# Patient Record
Sex: Male | Born: 1995 | Hispanic: No | Smoking: Heavy tobacco smoker
Health system: Southern US, Community
[De-identification: ages and names within clinical notes are randomized; demographics above are authoritative.]

---

## 2017-05-05 ENCOUNTER — Emergency Department
Admission: EM | Admit: 2017-05-05 | Discharge: 2017-05-05 | Disposition: A | Discharge status: Home / Self Care | Payer: Self-pay | Attending: Emergency Medicine | Admitting: Emergency Medicine

## 2017-05-05 ENCOUNTER — Encounter: Payer: Self-pay | Admitting: Emergency Medicine

## 2017-05-05 ENCOUNTER — Emergency Department: Payer: Self-pay

## 2017-05-05 DIAGNOSIS — Y929 Unspecified place or not applicable: Secondary | ICD-10-CM | POA: Insufficient documentation

## 2017-05-05 DIAGNOSIS — F17200 Nicotine dependence, unspecified, uncomplicated: Secondary | ICD-10-CM | POA: Insufficient documentation

## 2017-05-05 DIAGNOSIS — X501XXA Overexertion from prolonged static or awkward postures, initial encounter: Secondary | ICD-10-CM | POA: Insufficient documentation

## 2017-05-05 DIAGNOSIS — Y9301 Activity, walking, marching and hiking: Secondary | ICD-10-CM | POA: Insufficient documentation

## 2017-05-05 DIAGNOSIS — S93401A Sprain of unspecified ligament of right ankle, initial encounter: Secondary | ICD-10-CM | POA: Insufficient documentation

## 2017-05-05 DIAGNOSIS — Y999 Unspecified external cause status: Secondary | ICD-10-CM | POA: Insufficient documentation

## 2017-05-05 NOTE — Discharge Instructions (Signed)
Your exam and x-ray are negative for a fracture or dislocation. You have a grade II ankle sprain. Wear the splint for support as needed. Apply ice to reduce swelling. Take Ibuprofen for pain and inflammation. Rest with the foot elevated when seated. See Dr. Orland Jarredroxler for symptoms lasting longer than expected.

## 2017-05-05 NOTE — ED Triage Notes (Signed)
Pt arrived to the ED accompanied by his significant other for complaints of right ankle pain secondary to "rolling it" while at work. Pt is AOx4 in no apparent distress.

## 2017-05-06 NOTE — ED Provider Notes (Signed)
[facility] Emergency Department Provider Note ____________________________________________  Time seen: 2218  I have reviewed the triage vital signs and the nursing notes.  HISTORY  Chief Complaint  Ankle Pain  HPI Dean Sampson is a 21 y.o. male presents to the ED for evaluation of right ankle pain. He reports "rolling' his ankle at work today. He denies any other injuries. He notes swelling and pain with walking.   History reviewed. No pertinent past medical history.  There are no active problems to display for this patient.  History reviewed. No pertinent surgical history.  Prior to Admission medications   Not on File    Allergies Patient has no known allergies.  History reviewed. No pertinent family history.  Social History Social History  Substance Use Topics  . Smoking status: Heavy Tobacco Smoker  . Smokeless tobacco: Never Used  . Alcohol use Yes    Review of Systems  Constitutional: Negative for fever. Cardiovascular: Negative for chest pain. Respiratory: Negative for shortness of breath. Musculoskeletal: Negative for back pain. Right ankle pain and swelling  Skin: Negative for rash. Neurological: Negative for headaches, focal weakness or numbness. ____________________________________________  PHYSICAL EXAM:  VITAL SIGNS: ED Triage Vitals  Enc Vitals Group     BP 05/05/17 2118 (!) 146/62     Pulse Rate 05/05/17 2118 76     Resp 05/05/17 2118 18     Temp 05/05/17 2118 98.9 F (37.2 C)     Temp Source 05/05/17 2118 Oral     SpO2 05/05/17 2118 100 %     Weight 05/05/17 2120 245 lb (111.1 kg)     Height 05/05/17 2120 6\' 1"  (1.854 m)     Head Circumference --      Peak Flow --      Pain Score 05/05/17 2118 8     Pain Loc --      Pain Edu? --      Excl. in GC? --    Constitutional: Alert and oriented. Well appearing and in no distress. Cardiovascular: Normal distal pulses. Respiratory: Normal respiratory effort.  Musculoskeletal: Right ankle without obvious deformity, dislocation, or effusion. There is focal lateral malleolus swelling noted. No calf or Achilles tenderness is appreciated. Negative anterior/posterior drawer. Nontender with normal range of motion in all extremities.  Skin:  Skin is warm, dry and intact. No rash noted. ___________________________________________   RADIOLOGY  Right Ankle  IMPRESSION: Soft tissue edema without acute osseous abnormality.  I, Cordney Barstow, Charlesetta IvoryJenise V Bacon, personally viewed and evaluated these images (plain radiographs) as part of my medical decision making, as well as reviewing the written report by the radiologist. ____________________________________________  PROCEDURES  Velcro ankle stirrup  ____________________________________________  INITIAL IMPRESSION / ASSESSMENT AND PLAN / ED COURSE  Patient eating evaluation of a grade 2 right ankle sprain. No radiologic evidence of fracture or dislocation. An appropriate Velcro splint and given instructions to follow with [facility] as needed. A work is provided for 1 day as requested. ____________________________________________  FINAL CLINICAL IMPRESSION(S) / ED DIAGNOSES  Final diagnoses:  Sprain of right ankle, unspecified ligament, initial encounter      Lissa HoardMenshew, Nashali Ditmer V Bacon, PA-C 05/06/17 0010    Myrna BlazerSchaevitz, David Matthew, MD 05/07/17 [phone]

## 2018-03-09 IMAGING — CR DG ANKLE COMPLETE 3+V*R*
3 series · 3 of 3 positions shown · non-contrast
Comparison: None.

CLINICAL DATA: Right ankle pain after fall. Rolled ankle while at
work.

EXAM:
RIGHT ANKLE - COMPLETE 3+ VIEW

[ankle ap]
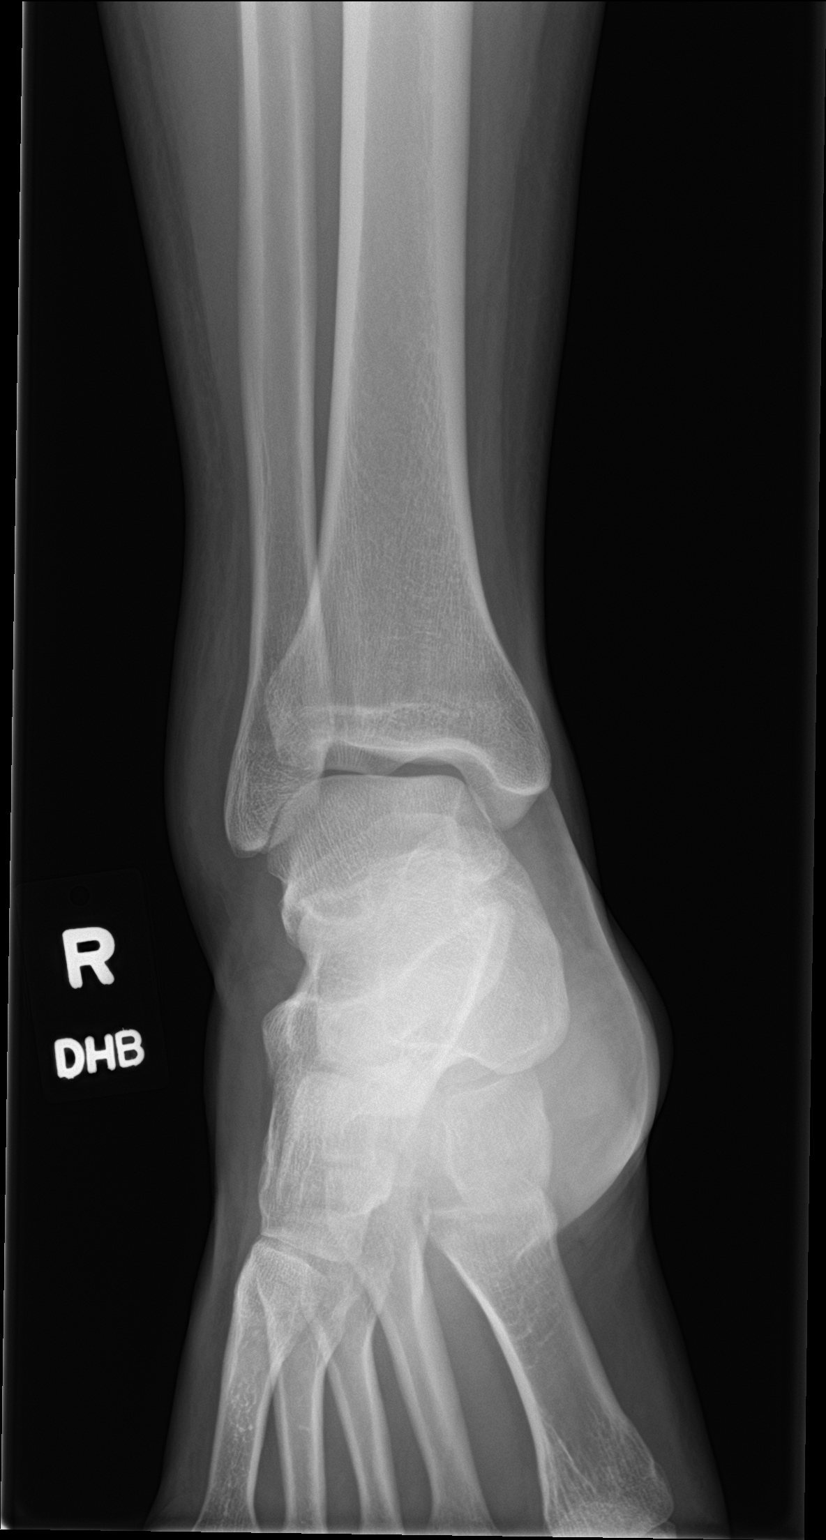

[ankle obl]
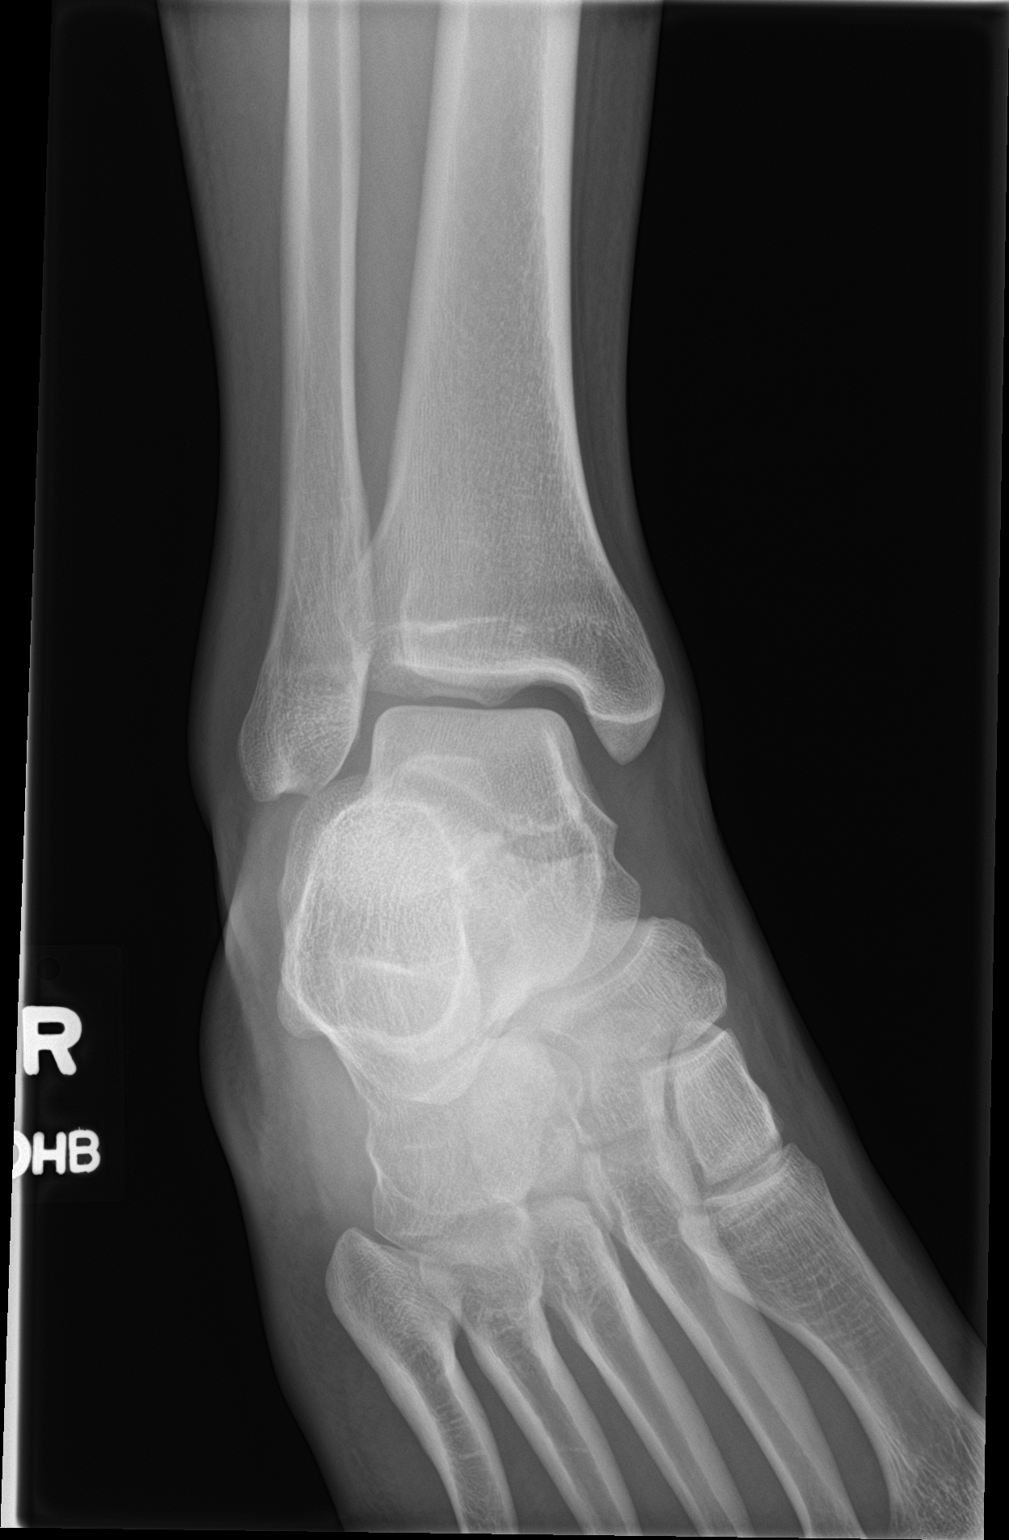

[ankle lat]
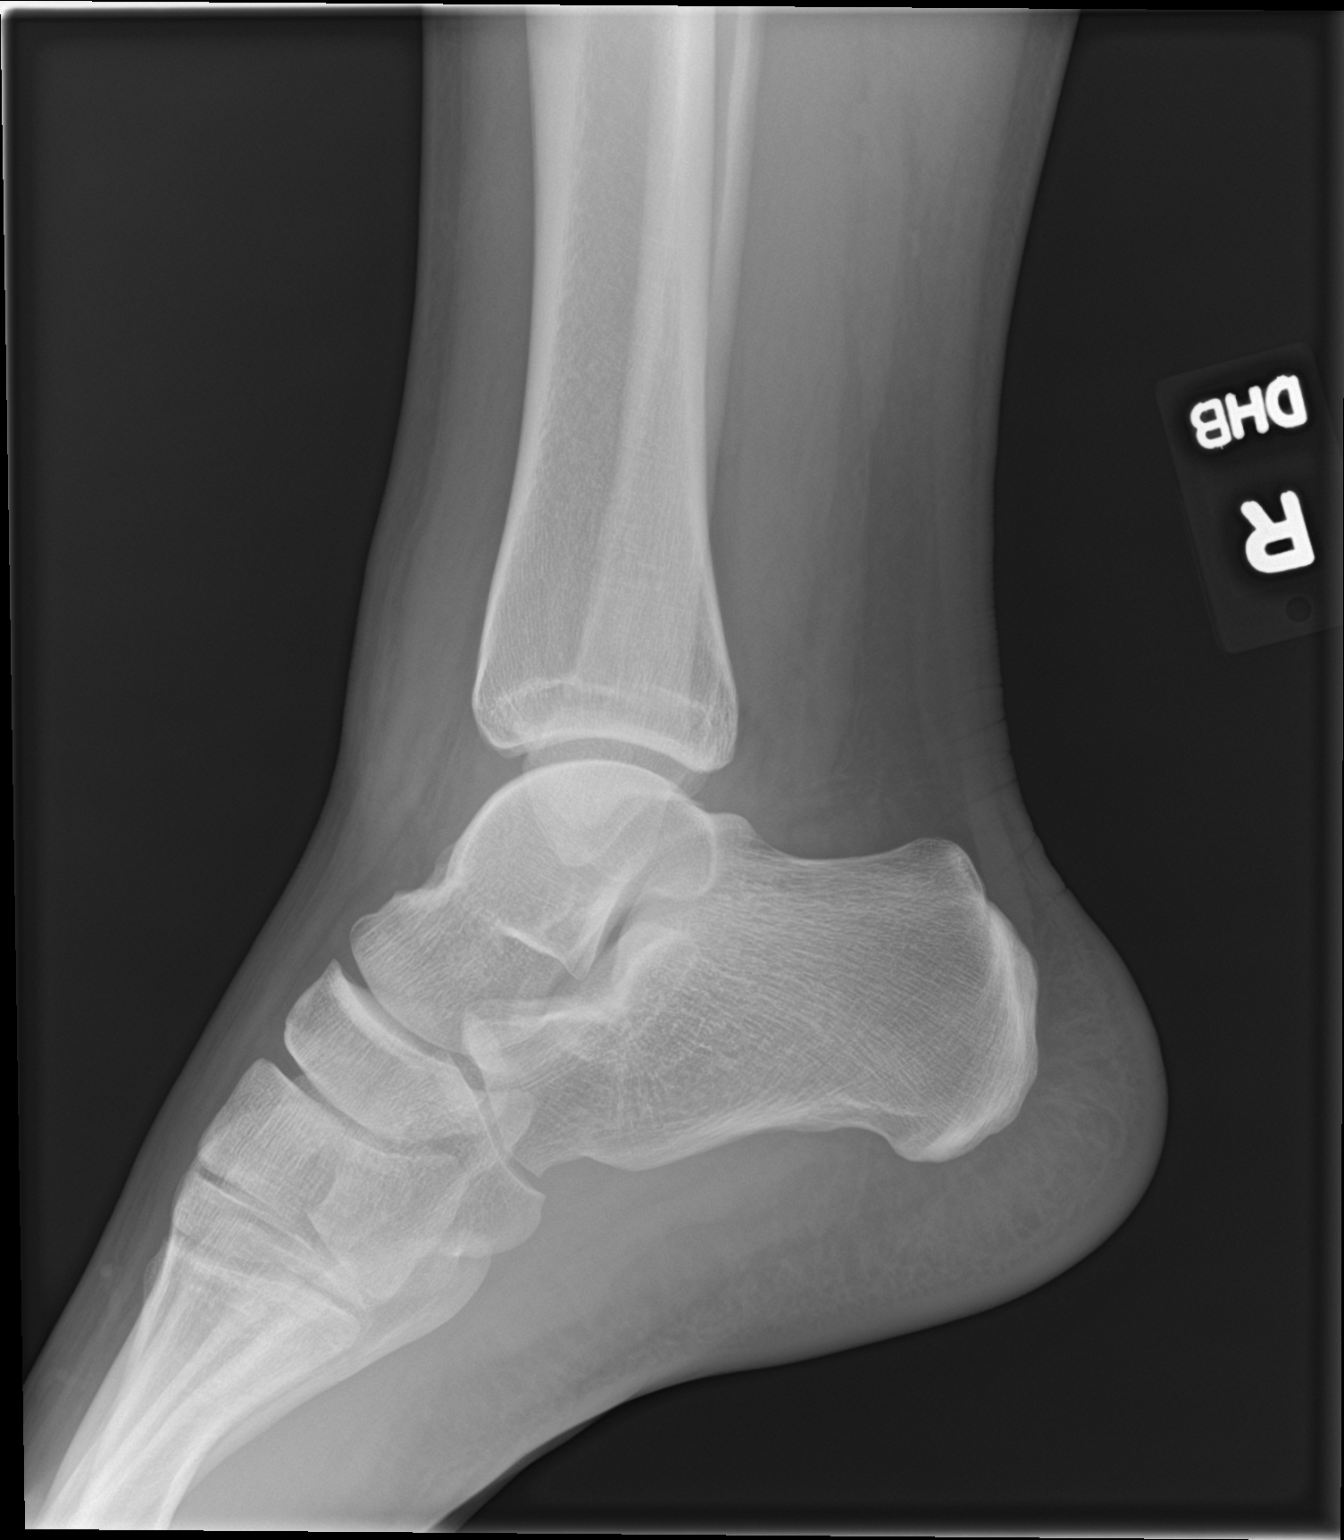

[3 of 3 positions shown; findings below may reference images not displayed]

FINDINGS: There is no evidence of fracture, dislocation, or joint effusion.
There is no evidence of arthropathy or other focal bone abnormality.
Soft tissue edema is most prominent laterally.
IMPRESSION: Soft tissue edema without acute osseous abnormality.
